# Patient Record
Sex: Female | Born: 1995 | Race: White | Hispanic: No | Marital: Single | Smoking: Current every day smoker
Health system: Southern US, Community
[De-identification: ages and names within clinical notes are randomized; demographics above are authoritative.]

## PROBLEM LIST (undated history)

## (undated) DIAGNOSIS — F419 Anxiety disorder, unspecified: Secondary | ICD-10-CM

## (undated) DIAGNOSIS — F32A Depression, unspecified: Secondary | ICD-10-CM

## (undated) DIAGNOSIS — F329 Major depressive disorder, single episode, unspecified: Secondary | ICD-10-CM

## (undated) HISTORY — DX: Anxiety disorder, unspecified: F41.9

## (undated) HISTORY — DX: Major depressive disorder, single episode, unspecified: F32.9

## (undated) HISTORY — DX: Depression, unspecified: F32.A

---
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
## 2016-04-10 DIAGNOSIS — F419 Anxiety disorder, unspecified: Secondary | ICD-10-CM | POA: Insufficient documentation

## 2016-07-05 ENCOUNTER — Encounter: Payer: Self-pay | Admitting: Emergency Medicine

## 2016-07-05 ENCOUNTER — Emergency Department
Admission: EM | Admit: 2016-07-05 | Discharge: 2016-07-05 | Disposition: A | Discharge status: Home / Self Care | Payer: BLUE CROSS/BLUE SHIELD | Source: Home / Self Care | Attending: Family Medicine | Admitting: Family Medicine

## 2016-07-05 ENCOUNTER — Emergency Department (INDEPENDENT_AMBULATORY_CARE_PROVIDER_SITE_OTHER): Payer: BLUE CROSS/BLUE SHIELD

## 2016-07-05 DIAGNOSIS — R6883 Chills (without fever): Secondary | ICD-10-CM

## 2016-07-05 DIAGNOSIS — R52 Pain, unspecified: Secondary | ICD-10-CM | POA: Diagnosis not present

## 2016-07-05 DIAGNOSIS — R05 Cough: Secondary | ICD-10-CM

## 2016-07-05 DIAGNOSIS — J069 Acute upper respiratory infection, unspecified: Secondary | ICD-10-CM

## 2016-07-05 DIAGNOSIS — R053 Chronic cough: Secondary | ICD-10-CM

## 2016-07-05 MED ORDER — AZITHROMYCIN 250 MG PO TABS: 250.0000 mg | ORAL_TABLET | Freq: Every day | ORAL | 0 refills | Status: DC

## 2016-07-05 MED ORDER — ALBUTEROL SULFATE HFA 108 (90 BASE) MCG/ACT IN AERS: 1.0000 | INHALATION_SPRAY | Freq: Four times a day (QID) | RESPIRATORY_TRACT | 0 refills | Status: AC | PRN

## 2016-07-05 MED ORDER — BENZONATATE 100 MG PO CAPS: 100.0000 mg | ORAL_CAPSULE | Freq: Three times a day (TID) | ORAL | 0 refills | Status: DC

## 2016-07-05 NOTE — ED Triage Notes (Signed)
Pt c/o dizziness, cough with mucous and body aches x3 days. Denies fever.

## 2016-07-05 NOTE — ED Provider Notes (Signed)
CSN: [id number]     Arrival date & time 07/05/16  1536 History   First MD Initiated Contact with Patient 07/05/16 1616     Chief Complaint  Patient presents with  . Cough   (Consider location/radiation/quality/duration/timing/severity/associated sxs/prior Treatment) HPI Mariah Garcia is a 21 y.o. female presenting to UC with c/o dizziness, mild to moderately intermittent cough with minimal white mucous production and body aches for 3-4 days but notes she has had a cough for about 3 months. She states she gets a cough in the winter for months at a time but has never been evaluated for the persistent cough.  Denies hx of asthma. Denies fever, chills, n/v/d.  She has been around others who have been sick recently.    History reviewed. No pertinent past medical history. History reviewed. No pertinent surgical history. History reviewed. No pertinent family history. Social History  Substance Use Topics  . Smoking status: Never Smoker  . Smokeless tobacco: Never Used  . Alcohol use No   OB History    No data available     Review of Systems  Constitutional: Negative for chills and fever.  HENT: Positive for congestion, postnasal drip and rhinorrhea. Negative for ear pain, sore throat, trouble swallowing and voice change.   Respiratory: Positive for cough. Negative for shortness of breath.   Cardiovascular: Negative for chest pain and palpitations.  Gastrointestinal: Negative for abdominal pain, diarrhea, nausea and vomiting.  Musculoskeletal: Negative for arthralgias, back pain and myalgias.  Skin: Negative for rash.  Neurological: Positive for dizziness. Negative for light-headedness and headaches.    Allergies  Patient has no allergy information on record.  Home Medications   Prior to Admission medications   Medication Sig Start Date End Date Taking? Authorizing Provider  albuterol (PROVENTIL HFA;VENTOLIN HFA) 108 (90 Base) MCG/ACT inhaler Inhale 1-2 puffs into the lungs every 6  (six) hours as needed for wheezing or shortness of breath. 07/05/16   Junius FinnerErin O'Malley, PA-C  azithromycin (ZITHROMAX) 250 MG tablet Take 1 tablet (250 mg total) by mouth daily. Take first 2 tablets together, then 1 every day until finished. 07/05/16   Junius FinnerErin O'Malley, PA-C  benzonatate (TESSALON) 100 MG capsule Take 1-2 capsules (100-200 mg total) by mouth every 8 (eight) hours. 07/05/16   Junius FinnerErin O'Malley, PA-C   Meds Ordered and Administered this Visit  Medications - No data to display  BP 129/79 (BP Location: Right Arm)   Pulse 93   Temp 98.8 F (37.1 C) (Oral)   Wt 200 lb (90.7 kg)   LMP 06/11/2016 (Approximate)   SpO2 96%  No data found.   Physical Exam  Constitutional: She is oriented to person, place, and time. She appears well-developed and well-nourished. No distress.  HENT:  Head: Normocephalic and atraumatic.  Right Ear: Tympanic membrane normal.  Left Ear: Tympanic membrane normal.  Nose: Nose normal.  Mouth/Throat: Uvula is midline, oropharynx is clear and moist and mucous membranes are normal.  Eyes: EOM are normal.  Neck: Normal range of motion. Neck supple.  Cardiovascular: Normal rate and regular rhythm.   Pulmonary/Chest: Effort normal and breath sounds normal. No stridor. No respiratory distress. She has no wheezes. She has no rales.  Intermittent dry hacking cough on exam. No respiratory distress.  Musculoskeletal: Normal range of motion.  Lymphadenopathy:    She has no cervical adenopathy.  Neurological: She is alert and oriented to person, place, and time.  Skin: Skin is warm and dry. She is not diaphoretic.  Psychiatric: She  has a normal mood and affect. Her behavior is normal.  Nursing note and vitals reviewed.   Urgent Care Course   Clinical Course     Procedures (including critical care time)  Labs Review Labs Reviewed - No data to display  Imaging Review Dg Chest 2 View  Result Date: 07/05/2016 CLINICAL DATA:  Pt states she has had a cough x 3  months and began having body aches with chills 3 days ago. EXAM: CHEST  2 VIEW COMPARISON:  None. FINDINGS: Midline trachea. Normal heart size and mediastinal contours. No pleural effusion or pneumothorax. Clear lungs. IMPRESSION: No acute cardiopulmonary disease. Electronically Signed   By: Jeronimo Greaves M.D.   On: 07/05/2016 16:35    MDM   1. Persistent cough for 3 weeks or longer   2. Upper respiratory tract infection, unspecified type    Pt c/o 3-4 days of body aches, dizziness and productive cough but notes she has had a cough for at least 3 months.   Due to chronicity of cough w/o prior medical evaluation, CXR ordered. CXR: unremarkable. Will cover for atypical bacteria.  Rx: azithromycin, tessalon, and albuterol inhaler.  Pt does not have a PCP, encouraged to call primary care at [facility] to establish a PCP for recheck of symptoms and ongoing healthcare needs.    Junius Finner, PA-C 07/05/16 1715

## 2016-08-27 DIAGNOSIS — F331 Major depressive disorder, recurrent, moderate: Secondary | ICD-10-CM | POA: Insufficient documentation

## 2017-08-14 ENCOUNTER — Ambulatory Visit (INDEPENDENT_AMBULATORY_CARE_PROVIDER_SITE_OTHER): Payer: BLUE CROSS/BLUE SHIELD | Admitting: Psychiatry

## 2017-08-14 ENCOUNTER — Encounter (HOSPITAL_COMMUNITY): Payer: Self-pay | Admitting: Psychiatry

## 2017-08-14 ENCOUNTER — Other Ambulatory Visit: Payer: Self-pay

## 2017-08-14 VITALS — BP 114/70 | HR 73 | Ht 65.0 in | Wt 199.0 lb

## 2017-08-14 DIAGNOSIS — R450 Nervousness: Secondary | ICD-10-CM

## 2017-08-14 DIAGNOSIS — F1220 Cannabis dependence, uncomplicated: Secondary | ICD-10-CM

## 2017-08-14 DIAGNOSIS — Z9189 Other specified personal risk factors, not elsewhere classified: Secondary | ICD-10-CM | POA: Diagnosis not present

## 2017-08-14 DIAGNOSIS — F419 Anxiety disorder, unspecified: Secondary | ICD-10-CM | POA: Diagnosis not present

## 2017-08-14 DIAGNOSIS — F17210 Nicotine dependence, cigarettes, uncomplicated: Secondary | ICD-10-CM | POA: Diagnosis not present

## 2017-08-14 DIAGNOSIS — F331 Major depressive disorder, recurrent, moderate: Secondary | ICD-10-CM

## 2017-08-14 MED ORDER — SERTRALINE HCL 50 MG PO TABS: 50.0000 mg | ORAL_TABLET | Freq: Every day | ORAL | 0 refills | Status: AC

## 2017-08-14 NOTE — Progress Notes (Signed)
Psychiatric Initial Adult Assessment   Patient Identification: Mariah Garcia MRN:  [id number] Date of Evaluation:  08/14/2017 Referral Source: primary care Chief Complaint:   Chief Complaint    Establish Care; Anxiety; Depression     Visit Diagnosis:    ICD-10-CM   1. Moderate episode of recurrent major depressive disorder (HCC) F33.1   2. Anxiety F41.9   3. Marijuana dependence (HCC) F12.20   4. Excessive consumption of coffee Z91.89     History of Present Illness:  22 years old White Single Female. Lives with parents  Working at [employer] for now and joining [employer] after summer  Referred for management of depression and anxiety patient endorses episodes of depression including withdrawn the motivation decreased energy crying spells that last for days. She feels irritable easily. She has been on different medications including Prozac and Lexapro status as it did not work. She does endorse his worries excessively at times  No clear manic symptoms except for irritablity for few hours at times or gets easily frustrated  She was about her parents were elderly. She feels that she may not have enough time with them. She went to school college but that left before finishing she is somewhat worried about joining back and that she be able to finish.  She will use marijuana every day for the last 7 years says that she has cut it down somewhat but somewhat difficult down more or to stop that he talked in detail about consequences and its effect on personality more depression and anxiety.  She uses red bull every day and also excessive coffee to talk about its effect on irritability and anxiety as well.  There is no abuse past.  Modifying factors; parents  Aggravating factors; excessive worries worried about school. And her future  Duration on and off for a few years  Currently concerned about excessive worries anxiety and irritability   Associated  Signs/Symptoms: Depression Symptoms:  depressed mood, anhedonia, fatigue, anxiety, loss of energy/fatigue, (Hypo) Manic Symptoms:  Distractibility, Labiality of Mood, Anxiety Symptoms:  Excessive Worry, Psychotic Symptoms:  denies PTSD Symptoms: NA  Past Psychiatric History: anxiety and depression  Previous Psychotropic Medications: Yes   Substance Abuse History in the last 12 months:  Yes.    Consequences of Substance Abuse: Medical Consequences:  depression . mood swings  Past Medical History:  Past Medical History:  Diagnosis Date  . Anxiety   . Depression    History reviewed. No pertinent surgical history.  Family Psychiatric History: denies  Family History: History reviewed. No pertinent family history.  Social History:   Social History   Socioeconomic History  . Marital status: Single    Spouse name: None  . Number of children: None  . Years of education: None  . Highest education level: None  Social Needs  . Financial resource strain: None  . Food insecurity - worry: None  . Food insecurity - inability: None  . Transportation needs - medical: None  . Transportation needs - non-medical: None  Occupational History  . None  Tobacco Use  . Smoking status: Current Every Day Smoker    Packs/day: 0.50    Types: Cigarettes  . Smokeless tobacco: Never Used  Substance and Sexual Activity  . Alcohol use: Yes    Comment: occasional  . Drug use: No  . Sexual activity: Yes    Birth control/protection: Implant  Other Topics Concern  . None  Social History Narrative  . None  Additional Social History: grew up with parents. Lovely childhood by them  Allergies:  No Known Allergies  Metabolic Disorder Labs: No results found for: HGBA1C, MPG No results found for: PROLACTIN No results found for: CHOL, TRIG, HDL, CHOLHDL, VLDL, LDLCALC   Current Medications: Current Outpatient Medications  Medication Sig Dispense Refill  . albuterol (PROVENTIL  HFA;VENTOLIN HFA) 108 (90 Base) MCG/ACT inhaler Inhale 1-2 puffs into the lungs every 6 (six) hours as needed for wheezing or shortness of breath. 1 Inhaler 0  . sertraline (ZOLOFT) 50 MG tablet Take 1 tablet (50 mg total) by mouth daily. 30 tablet 0   No current facility-administered medications for this visit.     Neurologic: Headache: No Seizure: No Paresthesias:No  Musculoskeletal: Strength & Muscle Tone: within normal limits Gait & Station: normal Patient leans: no lean  Psychiatric Specialty Exam: Review of Systems  Cardiovascular: Negative for chest pain.  Skin: Negative for rash.  Psychiatric/Behavioral: Positive for depression and substance abuse. The patient is nervous/anxious.     Blood pressure 114/70, pulse 73, height 5\' 5"  (1.651 m), weight 199 lb (90.3 kg).Body mass index is 33.12 kg/m.  General Appearance: Casual  Eye Contact:  Fair  Speech:  Slow  Volume:  Decreased  Mood:  Dysphoric  Affect:  Constricted  Thought Process:  Goal Directed  Orientation:  Full (Time, Place, and Person)  Thought Content:  Rumination  Suicidal Thoughts:  No  Homicidal Thoughts:  No  Memory:  Immediate;   Fair Recent;   Fair  Judgement:  Fair  Insight:  Shallow  Psychomotor Activity:  Normal  Concentration:  Concentration: Fair and Attention Span: Fair  Recall:  [employer] of Knowledge:Fair  Language: Fair  Akathisia:  No  Handed:  Right  AIMS (if indicated):  0  Assets:  Desire for Improvement  ADL's:  Intact  Cognition: WNL  Sleep:  fair    Treatment Plan Summary: Medication management and Plan as follows 1. Major depression recurrent moderateL: therapy didn't help so she stopped. Will start zoloft but made sure she has to let [facility] go otherwise it would not work 2. GAD: start lexapr Cut down coffee, cut down red bull 3. Marijuana use disorder: ongoing. Explained in detail its effect on personality, mood and meds would not work  Informed will do uds in one month  if she wants to comply with our meds and recommendations with our outpatient treatment  She has to consider to quit [facility] as part of treatment  Fu 4w  Thresa Ross, MD 2/22/201911:34 AM

## 2017-08-26 IMAGING — DX DG CHEST 2V
2 series · 2 of 2 positions shown · non-contrast
Comparison: None.

CLINICAL DATA: Pt states she has had a cough x 3 months and began
having body aches with chills 3 days ago.

EXAM:
CHEST  2 VIEW

[chest pa]
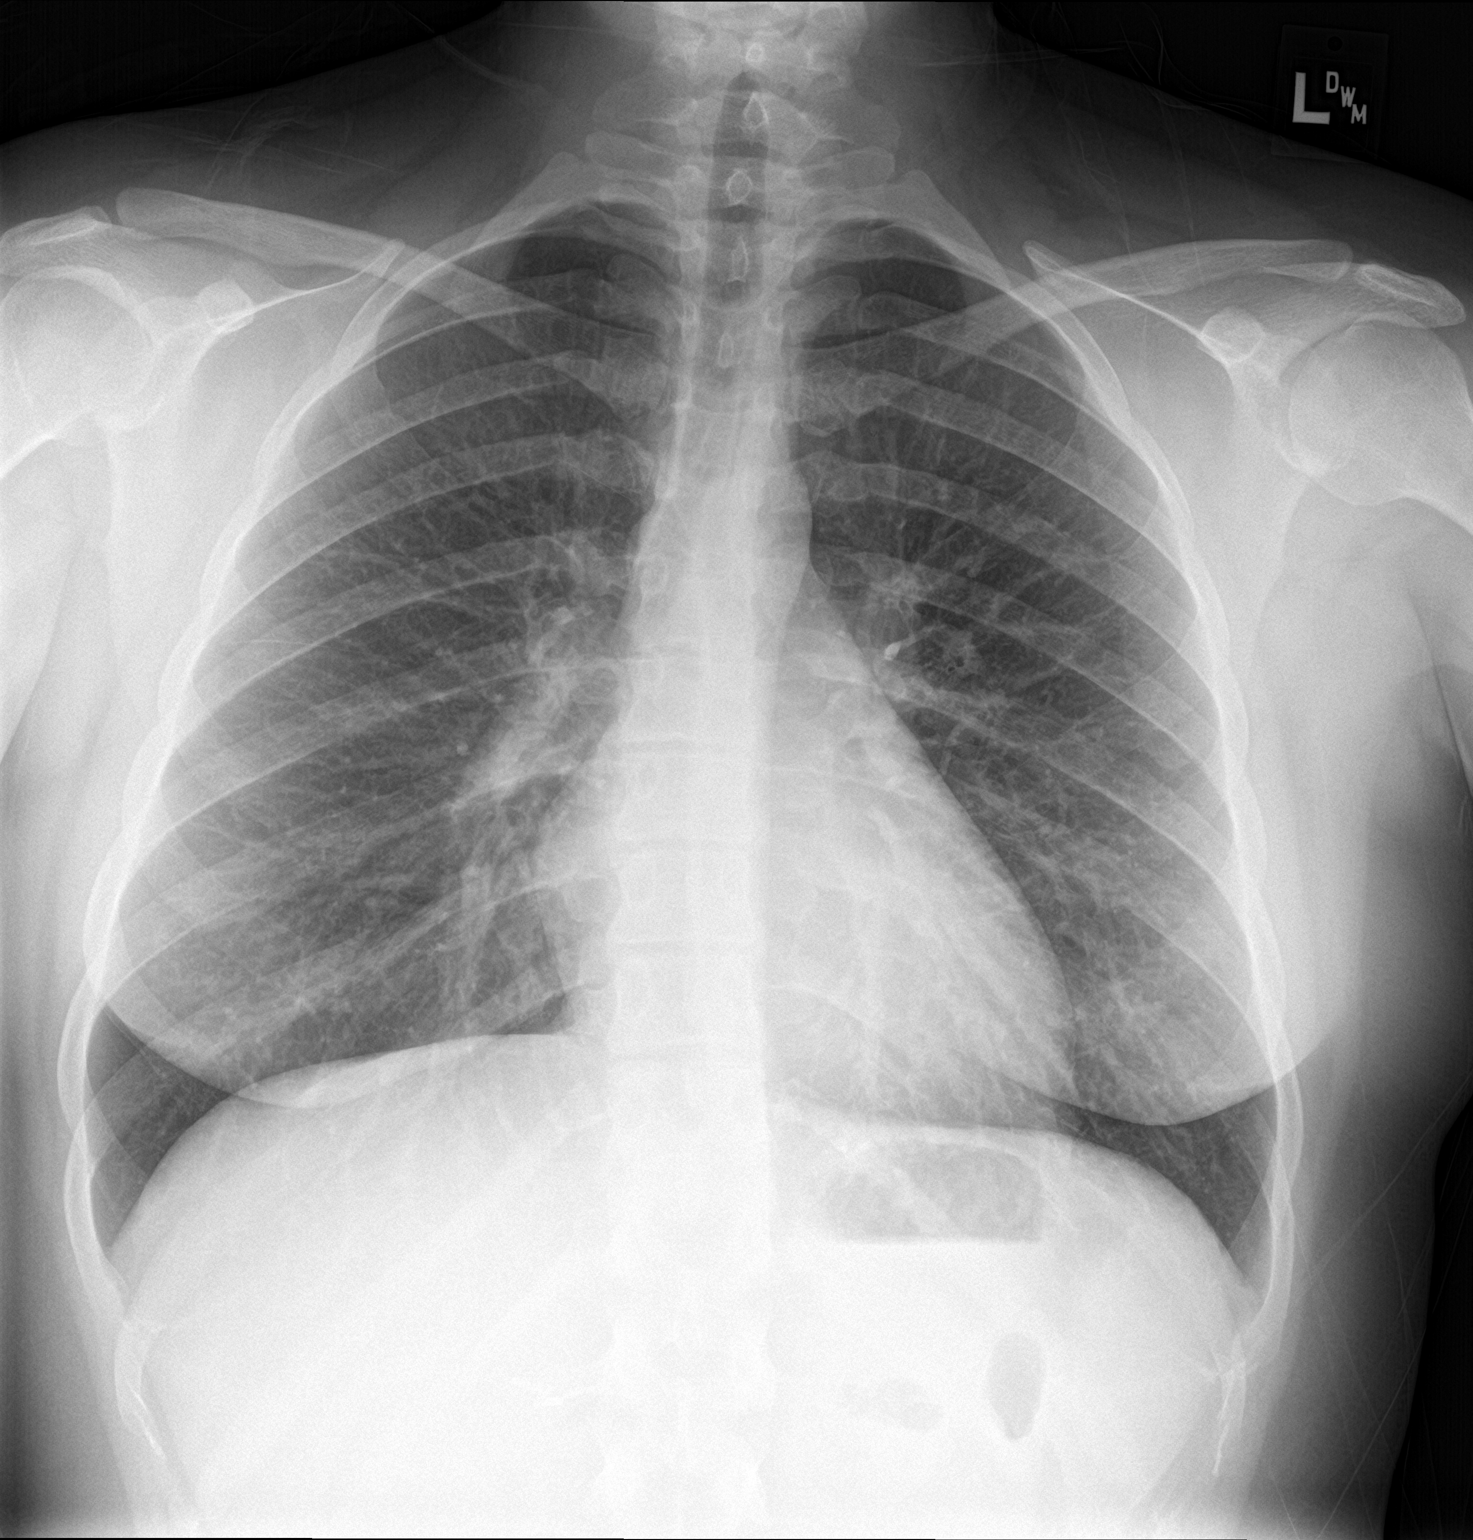

[chest lat]
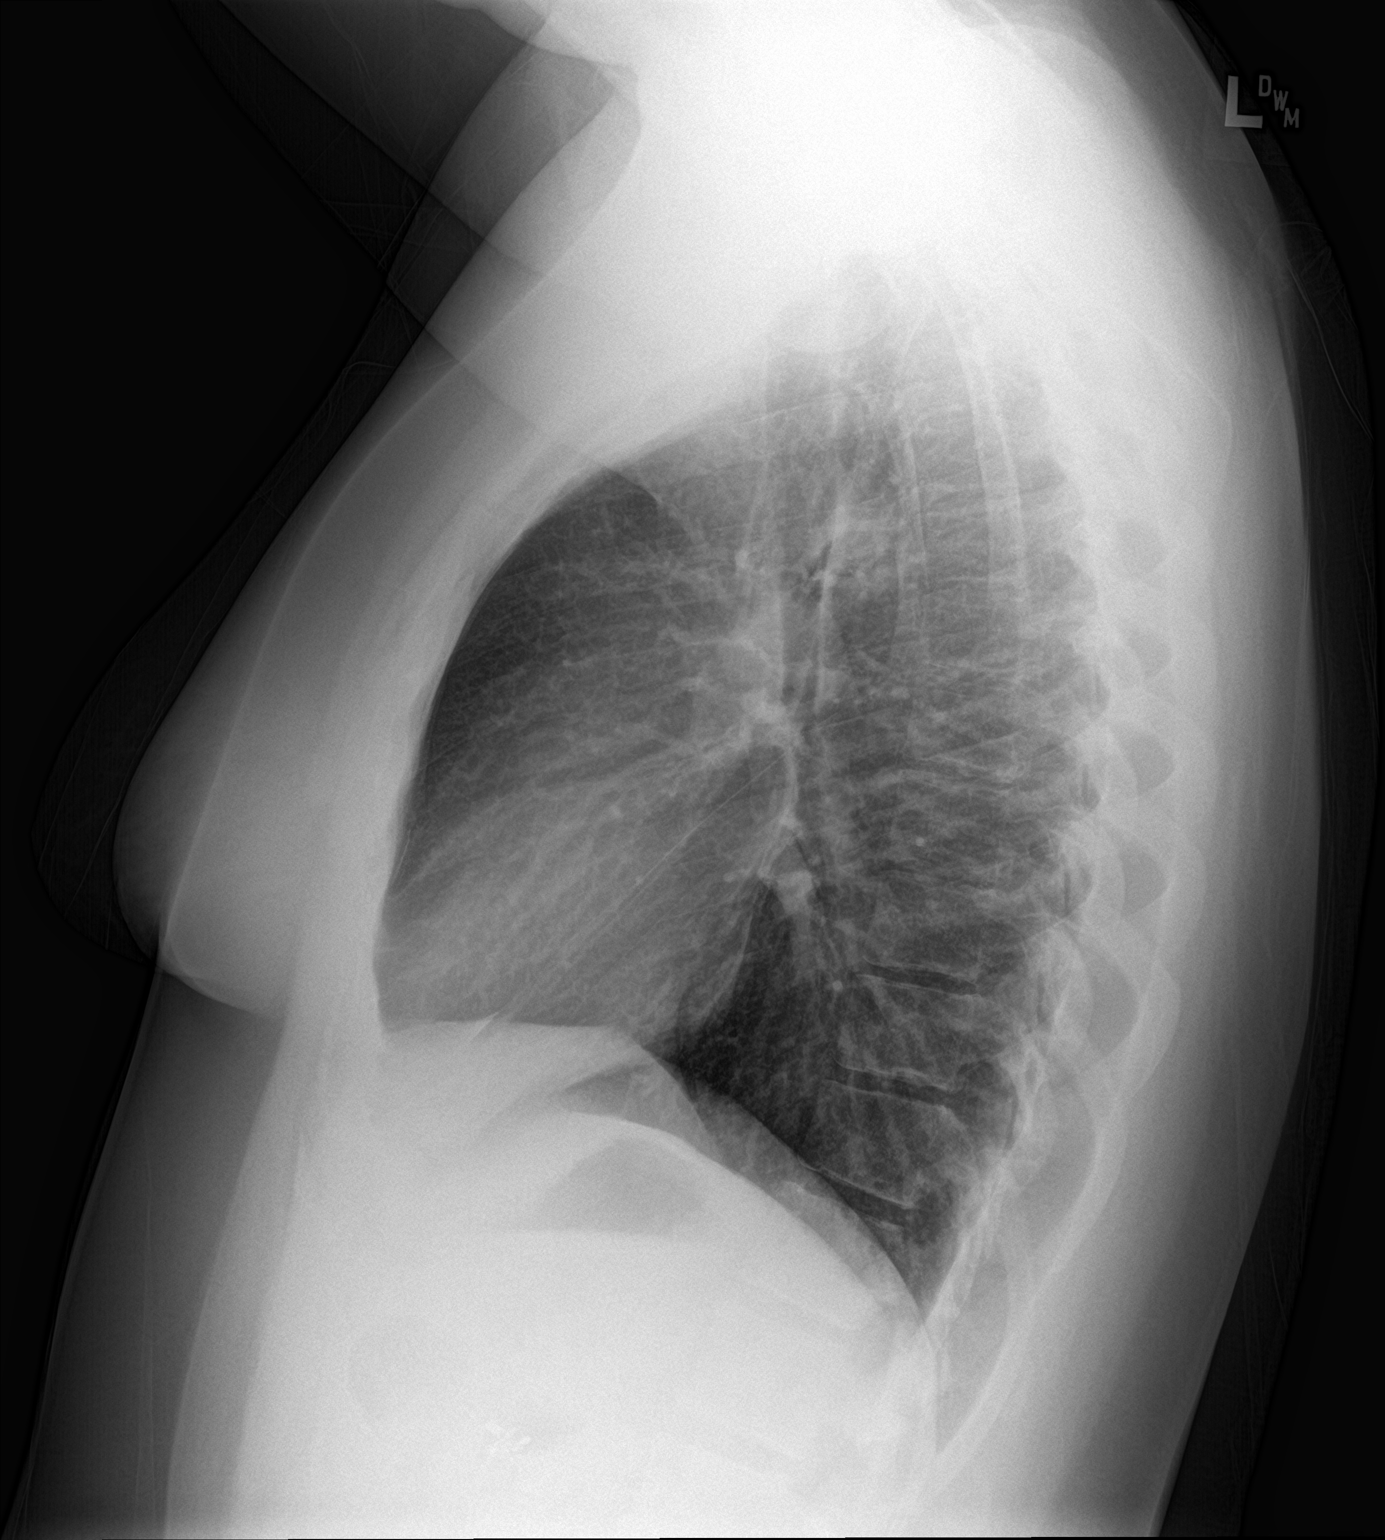

[2 of 2 positions shown; findings below may reference images not displayed]

FINDINGS: Midline trachea. Normal heart size and mediastinal contours. No
pleural effusion or pneumothorax. Clear lungs.
IMPRESSION: No acute cardiopulmonary disease.

## 2017-09-07 ENCOUNTER — Ambulatory Visit (HOSPITAL_COMMUNITY): Payer: Self-pay | Admitting: Psychiatry
